# Patient Record
Sex: Female | Born: 1955 | Race: Black or African American | Hispanic: No | Smoking: Current every day smoker
Health system: Southern US, Community
[De-identification: ages and names within clinical notes are randomized; demographics above are authoritative.]

## PROBLEM LIST (undated history)

## (undated) DIAGNOSIS — M069 Rheumatoid arthritis, unspecified: Secondary | ICD-10-CM

---

## 2015-09-23 ENCOUNTER — Emergency Department
Admission: EM | Admit: 2015-09-23 | Discharge: 2015-09-24 | Disposition: A | Discharge status: Home / Self Care | Payer: Self-pay | Attending: Emergency Medicine | Admitting: Emergency Medicine

## 2015-09-23 DIAGNOSIS — F17200 Nicotine dependence, unspecified, uncomplicated: Secondary | ICD-10-CM | POA: Insufficient documentation

## 2015-09-23 DIAGNOSIS — M069 Rheumatoid arthritis, unspecified: Secondary | ICD-10-CM | POA: Insufficient documentation

## 2015-09-23 DIAGNOSIS — M25561 Pain in right knee: Secondary | ICD-10-CM | POA: Insufficient documentation

## 2015-09-23 DIAGNOSIS — M25562 Pain in left knee: Secondary | ICD-10-CM | POA: Insufficient documentation

## 2015-09-23 DIAGNOSIS — M79606 Pain in leg, unspecified: Secondary | ICD-10-CM

## 2015-09-23 DIAGNOSIS — R202 Paresthesia of skin: Secondary | ICD-10-CM | POA: Insufficient documentation

## 2015-09-23 HISTORY — DX: Rheumatoid arthritis, unspecified: M06.9

## 2015-09-23 MED ORDER — SODIUM CHLORIDE 0.9 % IV SOLN
250.0000 mg | Freq: Once | INTRAVENOUS | Status: AC
  Administered 2015-09-24: 250 mg via INTRAVENOUS
  Filled 2015-09-23: qty 2

## 2015-09-23 MED ORDER — METHYLPREDNISOLONE SODIUM SUCC 125 MG IJ SOLR
INTRAMUSCULAR | Status: AC
  Filled 2015-09-23: qty 2

## 2015-09-23 NOTE — ED Notes (Signed)
Pt arrived via EMS with C/O numbness/tingling in legs, Hx rheumatoid arthritis, hasnt been able to take meds because she ran out and her PCP would not refill without a visit. Pt states the weather change has caused a flare-up.

## 2015-09-24 MED ORDER — PREDNISONE 20 MG PO TABS: 20.0000 mg | ORAL_TABLET | Freq: Every day | ORAL | Status: AC

## 2015-09-24 NOTE — Discharge Instructions (Signed)
Rheumatoid Arthritis Rheumatoid arthritis is a long-term (chronic) inflammatory disease that causes pain, swelling, and stiffness of the joints. It can affect the entire body, including the eyes and lungs. The effects of rheumatoid arthritis vary widely among those with the condition. CAUSES The cause of rheumatoid arthritis is not known. It tends to run in families and is more common in women. Certain cells of the body's natural defense system (immune system) do not work properly and begin to attack healthy joints. It primarily involves the connective tissue that lines the joints (synovial membrane). This can cause damage to the joint. SYMPTOMS  Pain, stiffness, swelling, and decreased motion of many joints, especially in the hands and feet.  Stiffness that is worse in the morning. It may last 1-2 hours or longer.  Numbness and tingling in the hands.  Fatigue.  Loss of appetite.  Weight loss.  Low-grade fever.  Dry eyes and mouth.  Firm lumps (rheumatoid nodules) that grow beneath the skin in areas such as the elbows and hands. DIAGNOSIS Diagnosis is based on the symptoms described, an exam, and blood tests. Sometimes, X-rays are helpful. TREATMENT The goals of treatment are to relieve pain, reduce inflammation, and to slow down or stop joint damage and disability. Methods vary and may include:  Maintaining a balance of rest, exercise, and proper nutrition.  Your health care provider may adjust your medicines every 3 months until treatment goals are reached. Common medicines include:  Pain relievers (analgesics).  Corticosteroids and nonsteroidal anti-inflammatory drugs (NSAIDs) to reduce inflammation.  Disease-modifying antirheumatic drugs (DMARDs) to try to slow the course of the disease.  Biologic response modifiers to reduce inflammation and damage.  Physical therapy and occupational therapy.  Surgery for patients with severe joint damage. Joint replacement or fusing of  joints may be needed.  Routine monitoring and ongoing care, such as office visits, blood and urine tests, and X-rays. Your health care provider will work with you to identify the best treatment option for you, based on an assessment of the overall disease activity in your body. HOME CARE INSTRUCTIONS  Remain physically active and reduce activity when the disease gets worse.  Eat a well-balanced diet.  Put heat on affected joints when you wake up and before activities. Keep the heat on the affected joint for as long as directed by your health care provider.  Put ice on affected joints following activities or exercising.  Put ice in a plastic bag.  Place a towel between your skin and the bag.  Leave the ice on for 15-20 minutes, 3-4 times per day, or as directed by your health care provider.  Take medicines and supplements only as directed by your health care provider.  Use splints as directed by your health care provider. Splints help maintain joint position and function.  Do not sleep with pillows under your knees. This may lead to spasms.  Participate in a self-management program to keep current with the latest treatment and coping skills. SEEK IMMEDIATE MEDICAL CARE IF:  You have fainting episodes.  You have periods of extreme weakness.  You rapidly develop a hot, painful joint that is more severe than usual joint aches.  You have chills.  You have a fever. FOR MORE INFORMATION  [employer] College [employer] Rheumat[employer]: www.rheumatology.org  Arthritis Foundation: www.arthritis.org   This information is not intended to replace advice given to you by your health care provider. Make sure you discuss any questions you have with your health care provider.   Document  Released: 09/19/2000 Document Revised: 10/13/2014 Document Reviewed: 10/29/2011 Elsevier Interactive Patient Education 2016 Elsevier Inc[employer]  Musculoskeletal Pain Musculoskeletal pain is muscle and boney aches and  pains. These pains can occur in any part of the body. Your caregiver may treat you without knowing the cause of the pain. They may treat you if blood or urine tests, X-rays, and other tests were normal.  CAUSES There is often not a definite cause or reason for these pains. These pains may be caused by a type of germ (virus). The discomfort may also come from overuse. Overuse includes working out too hard when your body is not fit. Boney aches also come from weather changes. Bone is sensitive to atmospheric pressure changes. HOME CARE INSTRUCTIONS   Ask when your test results will be ready. Make sure you get your test results.  Only take over-the-counter or prescription medicines for pain, discomfort, or fever as directed by your caregiver. If you were given medications for your condition, do not drive, operate machinery or power tools, or sign legal documents for 24 hours. Do not drink alcohol. Do not take sleeping pills or other medications that may interfere with treatment.  Continue all activities unless the activities cause more pain. When the pain lessens, slowly resume normal activities. Gradually increase the intensity and duration of the activities or exercise.  During periods of severe pain, bed rest may be helpful. Lay or sit in any position that is comfortable.  Putting ice on the injured area.  Put ice in a bag.  Place a towel between your skin and the bag.  Leave the ice on for 15 to 20 minutes, 3 to 4 times a day.  Follow up with your caregiver for continued problems and no reason can be found for the pain. If the pain becomes worse or does not go away, it may be necessary to repeat tests or do additional testing. Your caregiver may need to look further for a possible cause. SEEK IMMEDIATE MEDICAL CARE IF:  You have pain that is getting worse and is not relieved by medications.  You develop chest pain that is associated with shortness or breath, sweating, feeling sick to your  stomach (nauseous), or throw up (vomit).  Your pain becomes localized to the abdomen.  You develop any new symptoms that seem different or that concern you. MAKE SURE YOU:   Understand these instructions.  Will watch your condition.  Will get help right away if you are not doing well or get worse.   This information is not intended to replace advice given to you by your health care provider. Make sure you discuss any questions you have with your health care provider.   Document Released: 09/22/2005 Document Revised: 12/15/2011 Document Reviewed: 05/27/2013 Elsevier Interactive Patient Education [employer]

## 2015-09-24 NOTE — ED Provider Notes (Signed)
[facility] Emergency Department Provider Note  ____________________________________________  Time seen: 11:25 PM  I have reviewed the triage vital signs and the nursing notes.   HISTORY  Chief Complaint Numbness    HPI Stephanie Hickman is a 59 y.o. female who complains of gradual onset worsening constant bilateral knee pain over the past 3 days. She has a history of rheumatoid arthritis and she states this is entirely consistent with flareups that she occasionally has. She's been off of her Humira and out of prednisone as well due to lack of follow-up with primary care to get timely refills. Denies any falls or new injuries. No fever chills chest pain shortness of breath abdominal pain or vomiting. Pain of some tingling in the feet which is typical for her flareups. No bowel or bladder incontinence or saddle anesthesia or back pain.  Denies any other medical history such as diabetes hypertension or heart disease.   Past Medical History  Diagnosis Date  . Rheumatoid arthritis (HCC)      There are no active problems to display for this patient.    History reviewed. No pertinent past surgical history.   Current Outpatient Rx  Name  Route  Sig  Dispense  Refill  . predniSONE (DELTASONE) 20 MG tablet   Oral   Take 1 tablet (20 mg total) by mouth daily.   10 tablet   0      Allergies Aspirin   No family history on file.  Social History Social History  Substance Use Topics  . Smoking status: Current Every Day Smoker  . Smokeless tobacco: None  . Alcohol Use: No    Review of Systems  Constitutional:   No fever or chills. No weight changes Eyes:   No blurry vision or double vision.  ENT:   No sore throat. Cardiovascular:   No chest pain. Respiratory:   No dyspnea or cough. Gastrointestinal:   Negative for abdominal pain, vomiting and diarrhea.  No BRBPR or melena. Genitourinary:   Negative for dysuria, urinary retention, bloody urine, or  difficulty urinating. Musculoskeletal:   Negative for back pain. Positive bilateral knee pain  Skin:   Negative for rash. Neurological:   Negative for headaches, focal weakness or numbness. Psychiatric:  No anxiety or depression.   Endocrine:  No hot/cold intolerance, changes in energy, or sleep difficulty.  10-point ROS otherwise negative.  ____________________________________________   PHYSICAL EXAM:  VITAL SIGNS: ED Triage Vitals  Enc Vitals Group     BP 09/23/15 2306 129/98 mmHg     Pulse Rate 09/23/15 2306 83     Resp 09/23/15 2306 18     Temp 09/23/15 2306 97.7 F (36.5 C)     Temp Source 09/23/15 2306 Oral     SpO2 09/23/15 2306 98 %     Weight 09/23/15 2306 193 lb (87.544 kg)     Height 09/23/15 2306 5\' 8"  (1.727 m)     Head Cir --      Peak Flow --      Pain Score 09/23/15 2308 10     Pain Loc --      Pain Edu? --      Excl. in GC? --     Vital signs reviewed, nursing assessments reviewed.   Constitutional:   Alert and oriented. Well appearing and in no distress. Eyes:   No scleral icterus. No conjunctival pallor. PERRL. EOMI ENT   Head:   Normocephalic and atraumatic.   Nose:   No  congestion/rhinnorhea. No septal hematoma   Mouth/Throat:   MMM, no pharyngeal erythema. No peritonsillar mass. No uvula shift.   Neck:   No stridor. No SubQ emphysema. No meningismus. Hematological/Lymphatic/Immunilogical:   No cervical lymphadenopathy. Cardiovascular:   RRR. Normal and symmetric distal pulses are present in all extremities. No murmurs, rubs, or gallops. Respiratory:   Normal respiratory effort without tachypnea nor retractions. Breath sounds are clear and equal bilaterally. No wheezes/rales/rhonchi. Gastrointestinal:   Soft and nontender. No distention. There is no CVA tenderness.  No rebound, rigidity, or guarding. Genitourinary:   deferred Musculoskeletal:   Mild tenderness bilateral knees, left greater than right. No edema, no swelling. No soft  tissue tenderness. No effusions. Intact range of motion. Extremities otherwise unremarkable. Neurologic:   Normal speech and language.  CN 2-10 normal. Motor grossly intact. No gross focal neurologic deficits are appreciated.  Skin:    Skin is warm, dry and intact. No rash noted.  No petechiae, purpura, or bullae. Psychiatric:   Mood and affect are normal. Speech and behavior are normal. Patient exhibits appropriate insight and judgment.  ____________________________________________    LABS (pertinent positives/negatives) (all labs ordered are listed, but only abnormal results are displayed) Labs Reviewed - No data to display ____________________________________________   EKG    ____________________________________________    RADIOLOGY    ____________________________________________   PROCEDURES   ____________________________________________   INITIAL IMPRESSION / ASSESSMENT AND PLAN / ED COURSE  Pertinent labs & imaging results that were available during my care of the patient were reviewed by me and considered in my medical decision making (see chart for details).  Patient presents with rheumatoid arthritis flareup. I will give her IV Solu-Medrol and reassess. Likely plan is to start on oral prednisone and have her follow up with primary care. Low suspicion for septic arthritis or osteomyelitis fracture dislocation or soft tissue infection.  ----------------------------------------- 12:40 AM on 09/24/2015 -----------------------------------------  Feels much better. We'll discharge home with oral prednisone. She'll follow-up with her doctor this week, will continue her weekly methotrexate injection that she is scheduled for today.   ____________________________________________   FINAL CLINICAL IMPRESSION(S) / ED DIAGNOSES  Final diagnoses:  Musculoskeletal pain of lower extremity, unspecified laterality  Rheumatoid arthritis flare (HCC)      Sharman Cheek, MD 09/24/15 0040

## 2015-11-16 ENCOUNTER — Emergency Department
Admission: EM | Admit: 2015-11-16 | Discharge: 2015-11-16 | Disposition: A | Discharge status: Home / Self Care | Payer: Self-pay | Attending: Emergency Medicine | Admitting: Emergency Medicine

## 2015-11-16 ENCOUNTER — Emergency Department: Payer: Self-pay

## 2015-11-16 DIAGNOSIS — F17200 Nicotine dependence, unspecified, uncomplicated: Secondary | ICD-10-CM | POA: Insufficient documentation

## 2015-11-16 DIAGNOSIS — F419 Anxiety disorder, unspecified: Secondary | ICD-10-CM | POA: Insufficient documentation

## 2015-11-16 DIAGNOSIS — R42 Dizziness and giddiness: Secondary | ICD-10-CM | POA: Insufficient documentation

## 2015-11-16 DIAGNOSIS — R112 Nausea with vomiting, unspecified: Secondary | ICD-10-CM | POA: Insufficient documentation

## 2015-11-16 DIAGNOSIS — Z7952 Long term (current) use of systemic steroids: Secondary | ICD-10-CM | POA: Insufficient documentation

## 2015-11-16 LAB — CBC WITH DIFFERENTIAL/PLATELET
BASOS ABS: 0.1 10*3/uL (ref 0–0.1)
BASOS PCT: 1 %
EOS PCT: 3 %
Eosinophils Absolute: 0.2 10*3/uL (ref 0–0.7)
HCT: 41.5 % (ref 35.0–47.0)
Hemoglobin: 13.4 g/dL (ref 12.0–16.0)
LYMPHS PCT: 27 %
Lymphs Abs: 2.4 10*3/uL (ref 1.0–3.6)
MCH: 30.2 pg (ref 26.0–34.0)
MCHC: 32.3 g/dL (ref 32.0–36.0)
MCV: 93.7 fL (ref 80.0–100.0)
MONO ABS: 0.7 10*3/uL (ref 0.2–0.9)
MONOS PCT: 8 %
Neutro Abs: 5.4 10*3/uL (ref 1.4–6.5)
Neutrophils Relative %: 61 %
PLATELETS: 281 10*3/uL (ref 150–440)
RBC: 4.43 MIL/uL (ref 3.80–5.20)
RDW: 15.6 % — AB (ref 11.5–14.5)
WBC: 8.9 10*3/uL (ref 3.6–11.0)

## 2015-11-16 LAB — COMPREHENSIVE METABOLIC PANEL
ALT: 10 U/L — ABNORMAL LOW (ref 14–54)
ANION GAP: 7 (ref 5–15)
AST: 19 U/L (ref 15–41)
Albumin: 2.8 g/dL — ABNORMAL LOW (ref 3.5–5.0)
Alkaline Phosphatase: 61 U/L (ref 38–126)
BUN: 11 mg/dL (ref 6–20)
CHLORIDE: 108 mmol/L (ref 101–111)
CO2: 22 mmol/L (ref 22–32)
Calcium: 8.1 mg/dL — ABNORMAL LOW (ref 8.9–10.3)
Creatinine, Ser: 0.97 mg/dL (ref 0.44–1.00)
GFR calc Af Amer: >60 mL/min (ref >60)
Glucose, Bld: 173 mg/dL — ABNORMAL HIGH (ref 65–99)
POTASSIUM: 3.6 mmol/L (ref 3.5–5.1)
Sodium: 137 mmol/L (ref 135–145)
TOTAL PROTEIN: 5.7 g/dL — AB (ref 6.5–8.1)
Total Bilirubin: 0.5 mg/dL (ref 0.3–1.2)

## 2015-11-16 LAB — URINALYSIS COMPLETE WITH MICROSCOPIC (ARMC ONLY)
Bilirubin Urine: NEGATIVE
Glucose, UA: NEGATIVE mg/dL
HGB URINE DIPSTICK: NEGATIVE
KETONES UR: NEGATIVE mg/dL
LEUKOCYTES UA: NEGATIVE
NITRITE: NEGATIVE
PH: 5 (ref 5.0–8.0)
PROTEIN: NEGATIVE mg/dL
SPECIFIC GRAVITY, URINE: 1.018 (ref 1.005–1.030)

## 2015-11-16 LAB — BRAIN NATRIURETIC PEPTIDE: B NATRIURETIC PEPTIDE 5: 69 pg/mL (ref 0.0–100.0)

## 2015-11-16 LAB — TROPONIN I: TROPONIN I: 0.03 ng/mL (ref <0.031)

## 2015-11-16 MED ORDER — SODIUM CHLORIDE 0.9 % IV BOLUS (SEPSIS)
1000.0000 mL | Freq: Once | INTRAVENOUS | Status: AC
  Administered 2015-11-16: 1000 mL via INTRAVENOUS

## 2015-11-16 MED ORDER — SODIUM CHLORIDE 0.9 % IV BOLUS (SEPSIS)
500.0000 mL | Freq: Once | INTRAVENOUS | Status: AC
  Administered 2015-11-16: 500 mL via INTRAVENOUS

## 2015-11-16 NOTE — ED Provider Notes (Addendum)
[facility] Emergency Department Provider Note  ____________________________________________   I have reviewed the triage vital signs and the nursing notes.   HISTORY  Chief Complaint Weakness    HPI Stephanie Hickman is a 60 y.o. female with a history of recurrent arthritis on prednisone otherwise largely healthy states for the last week or so she's been having a her eye with cough and runny nose. He is currently unemployed so she is raising my medical neoplasm which is been doing twice a week for many months. Today she states they were unable to replace the plasma that they took out the usually do. Apparently there was some complication with the machine. They then discharged her apparently fluid depleted compared to when she arrived. She states she felt okay when she left but then as she stood up in the grocery store she became lightheaded and felt generally weak. She did not fall or hit her head or pass out. She did have some nausea and vomiting. She denies chest pain. She denies shortness of breath this time. Her EMS she was very anxious and upset. Their initial blood pressure was in the 90s. They did institute an IV and started giving her fluid she has had about 100 mL's prior to arrival. She denies any abdominal pain or leg swelling  Past Medical History  Diagnosis Date  . Rheumatoid arthritis (HCC)     There are no active problems to display for this patient.   History reviewed. No pertinent past surgical history.  Current Outpatient Rx  Name  Route  Sig  Dispense  Refill  . predniSONE (DELTASONE) 20 MG tablet   Oral   Take 1 tablet (20 mg total) by mouth daily.   10 tablet   0     Allergies Aspirin  No family history on file.  Social History Social History  Substance Use Topics  . Smoking status: Current Every Day Smoker  . Smokeless tobacco: None  . Alcohol Use: No    Review of Systems Constitutional: No fever/chills Eyes: No visual  changes. ENT: No sore throat. No stiff neck no neck pain Cardiovascular: Denies chest pain. Respiratory: Denies shortness of breath. Gastrointestinal:  See history of present illness  Genitourinary: Negative for dysuria. Musculoskeletal: Negative lower extremity swelling Skin: Negative for rash. Neurological: Negative for headaches, focal weakness or numbness. 10-point ROS otherwise negative.  ____________________________________________   PHYSICAL EXAM:  VITAL SIGNS: ED Triage Vitals  Enc Vitals Group     BP 11/16/15 1613 97/80 mmHg     Pulse Rate 11/16/15 1613 93     Resp 11/16/15 1613 22     Temp 11/16/15 1613 97.9 F (36.6 C)     Temp Source 11/16/15 1613 Oral     SpO2 11/16/15 1613 94 %     Weight 11/16/15 1613 190 lb (86.183 kg)     Height 11/16/15 1613 5\' 8"  (1.727 m)     Head Cir --      Peak Flow --      Pain Score 11/16/15 1614 6     Pain Loc --      Pain Edu? --      Excl. in GC? --     Constitutional: Alert and oriented. Well appearing and in no acute distress.Very anxious and upset but otherwise nontoxic  Eyes: Conjunctivae are normal. PERRL. EOMI. Head: Atraumatic. Nose: No congestion/rhinnorhea. Mouth/Throat: Mucous membranes are moist.  Oropharynx non-erythematous. Neck: No stridor.   Nontender with no meningismus Cardiovascular:  Normal rate, regular rhythm. Grossly normal heart sounds.  Good peripheral circulation. Respiratory: Normal respiratory effort.  No retractions. Lungs CTAB. Abdominal: Soft and nontender. No distention. No guarding no rebound Back:  There is no focal tenderness or step off there is no midline tenderness there are no lesions noted. there is no CVA tenderness Musculoskeletal: No lower extremity tenderness. No joint effusions, no DVT signs strong distal pulses no edema Neurologic:  Normal speech and language. No gross focal neurologic deficits are appreciated.  Skin:  Skin is warm, dry and intact. No rash noted. Psychiatric: Mood  and affect areanxious and upset . Speech and behavior are normal.  ____________________________________________   LABS (all labs ordered are listed, but only abnormal results are displayed)  Labs Reviewed  CBC WITH DIFFERENTIAL/PLATELET - Abnormal; Notable for the following:    RDW 15.6 (*)    All other components within normal limits  COMPREHENSIVE METABOLIC PANEL  TROPONIN I  BRAIN NATRIURETIC PEPTIDE   ____________________________________________  EKG  I personally interpreted any EKGs ordered by me or triage Normal sinus rhythm rate 97 bpm no acute ST elevation or acute ST depression normal axis unremarkable EKG  ____________________________________________  RADIOLOGY  I reviewed any imaging ordered by me or triage that were performed during my shift ____________________________________________   PROCEDURES  Procedure(s) performed: None  Critical Care performed: None  ____________________________________________   INITIAL IMPRESSION / ASSESSMENT AND PLAN / ED COURSE  Pertinent labs & imaging results that were available during my care of the patient were reviewed by me and considered in my medical decision making (see chart for details).  Patient with lightheadedness in the context of giving plasma, and having a head cold. She knew the pass out and then vomited. It is certainly consistent with a vasovagal reaction to volume depletion. We will give her IV fluids we'll check basic blood work and reassess. At this time there is no evidence of DIC or other significant lung pathology. Patient does have a cough and we'll obtain a chest x-ray. I suspect the combination of URI symptoms and frequent plasma donations coupled with not being able to have her plasma return to her likely cause these symptoms.   ----------------------------------------- 6:21 PM on 11/16/2015 -----------------------------------------  She feels much better after a sandwich and IV fluid. No  evidence of significant reaction from her procedure, she is comfortable in the bed heart rate is in the 90s blood pressures in the 130s. We'll check a UA on her precaution give her 500 more of IV fluid as a precaution and I think after that she can go home. Patient feels much better has no complaints ____________________________________________   FINAL CLINICAL IMPRESSION(S) / ED DIAGNOSES  Final diagnoses:  None      This chart was dictated using voice recognition software.  Despite best efforts to proofread,  errors can occur which can change meaning.    Jeanmarie Plant, MD 11/16/15 1657  Jeanmarie Plant, MD 11/16/15 Rickey Primus

## 2015-11-16 NOTE — Discharge Instructions (Signed)
Dizziness I would avoid giving plasma for a few weeks. Return to the emergency department for any new or worrisome symptoms. Dizziness is a common problem. It is a feeling of unsteadiness or light-headedness. You may feel like you are about to faint. Dizziness can lead to injury if you stumble or fall. Anyone can become dizzy, but dizziness is more common in older adults. This condition can be caused by a number of things, including medicines, dehydration, or illness. HOME CARE INSTRUCTIONS Taking these steps may help with your condition: Eating and Drinking  Drink enough fluid to keep your urine clear or pale yellow. This helps to keep you from becoming dehydrated. Try to drink more clear fluids, such as water.  Do not drink alcohol.  Limit your caffeine intake if directed by your health care provider.  Limit your salt intake if directed by your health care provider. Activity  Avoid making quick movements.  Rise slowly from chairs and steady yourself until you feel okay.  In the morning, first sit up on the side of the bed. When you feel okay, stand slowly while you hold onto something until you know that your balance is fine.  Move your legs often if you need to stand in one place for a long time. Tighten and relax your muscles in your legs while you are standing.  Do not drive or operate heavy machinery if you feel dizzy.  Avoid bending down if you feel dizzy. Place items in your home so that they are easy for you to reach without leaning over. Lifestyle  Do not use any tobacco products, including cigarettes, chewing tobacco, or electronic cigarettes. If you need help quitting, ask your health care provider.  Try to reduce your stress level, such as with yoga or meditation. Talk with your health care provider if you need help. General Instructions  Watch your dizziness for any changes.  Take medicines only as directed by your health care provider. Talk with your health care  provider if you think that your dizziness is caused by a medicine that you are taking.  Tell a friend or a family member that you are feeling dizzy. If he or she notices any changes in your behavior, have this person call your health care provider.  Keep all follow-up visits as directed by your health care provider. This is important. SEEK MEDICAL CARE IF:  Your dizziness does not go away.  Your dizziness or light-headedness gets worse.  You feel nauseous.  You have reduced hearing.  You have new symptoms.  You are unsteady on your feet or you feel like the room is spinning. SEEK IMMEDIATE MEDICAL CARE IF:  You vomit or have diarrhea and are unable to eat or drink anything.  You have problems talking, walking, swallowing, or using your arms, hands, or legs.  You feel generally weak.  You are not thinking clearly or you have trouble forming sentences. It may take a friend or family member to notice this.  You have chest pain, abdominal pain, shortness of breath, or sweating.  Your vision changes.  You notice any bleeding.  You have a headache.  You have neck pain or a stiff neck.  You have a fever.   This information is not intended to replace advice given to you by your health care provider. Make sure you discuss any questions you have with your health care provider.   Document Released: 03/18/2001 Document Revised: 02/06/2015 Document Reviewed: 09/18/2014 Elsevier Interactive Patient Education 2016 Elsevier  Inc. ° °

## 2015-11-16 NOTE — ED Notes (Signed)
Pt attempted to leave before D/C.  Asked pt to go back to room.  Pt stated "I got to go, I can't spend the night here".  Told pt that she did not have to spend night but requested she stay till D/C paperwork could be gone through.  Pt stated she needed to be in lobby to call for ride, told pt she could call from room.  Pt insistent on leaving.  Told pt she was free to leave but this RN would need to remove IV.  Pt state "I don't have them, I took all of that off".  Had pt sit in wheelchair and roll up sleeves.  Pt did so reluctantly and pt did have IV that this RN removed.  Told pt she was free to go to lobby, but pt requested to go back to room.

## 2015-11-16 NOTE — ED Notes (Addendum)
Pt bib EMS w/ c/o weakness, n/v and cough after donating at plasma center.  Pt sts she has been donating 2 X week for 3 months.  Pt also c/o cough/head cold for 1 week.  Pt anxious, but resp even and unlabored. Pt c/o L side pain.  Pt received 4 zofran IV

## 2016-10-28 IMAGING — CR DG CHEST 2V
2 series · 2 of 2 positions shown · non-contrast
Comparison: None.

CLINICAL DATA: Weakness following giving plasma

EXAM:
CHEST  2 VIEW

[chest lat]
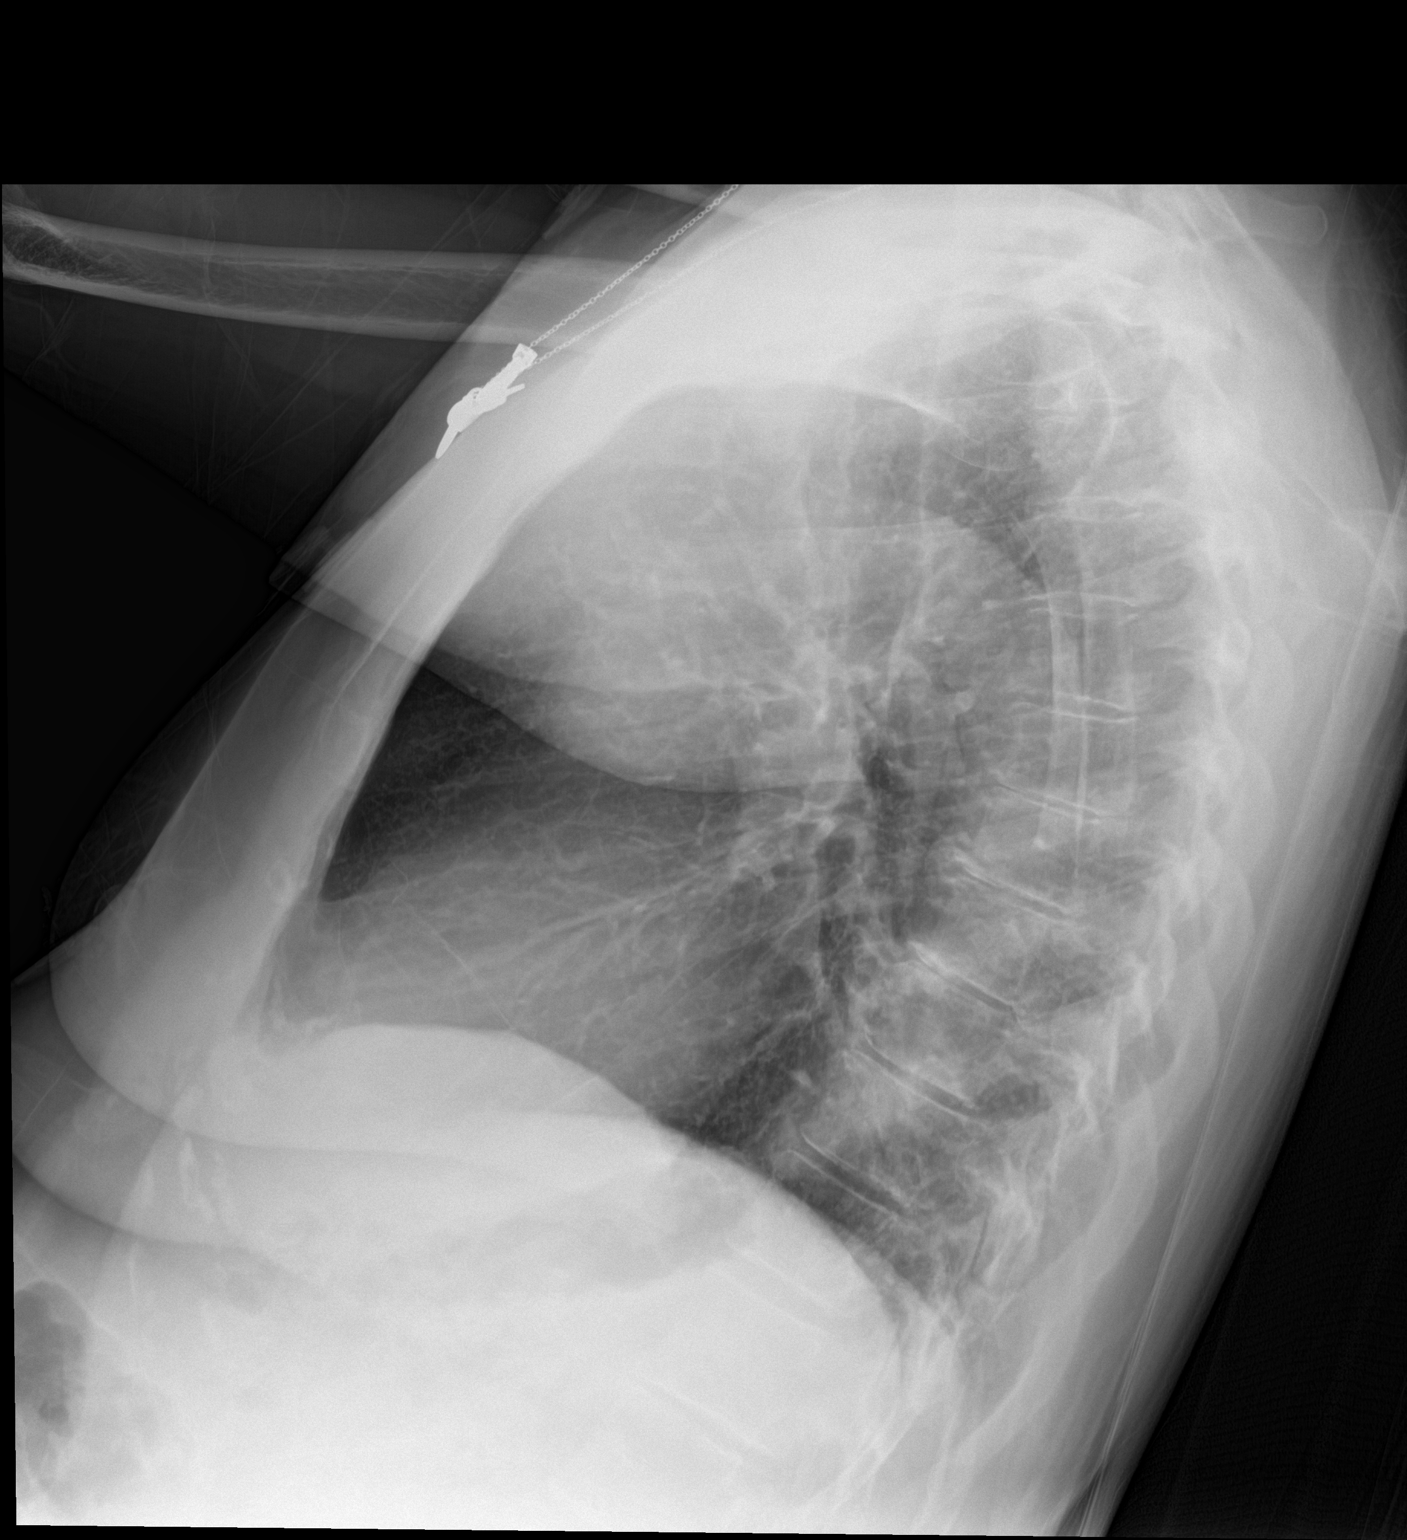

[chest ap]
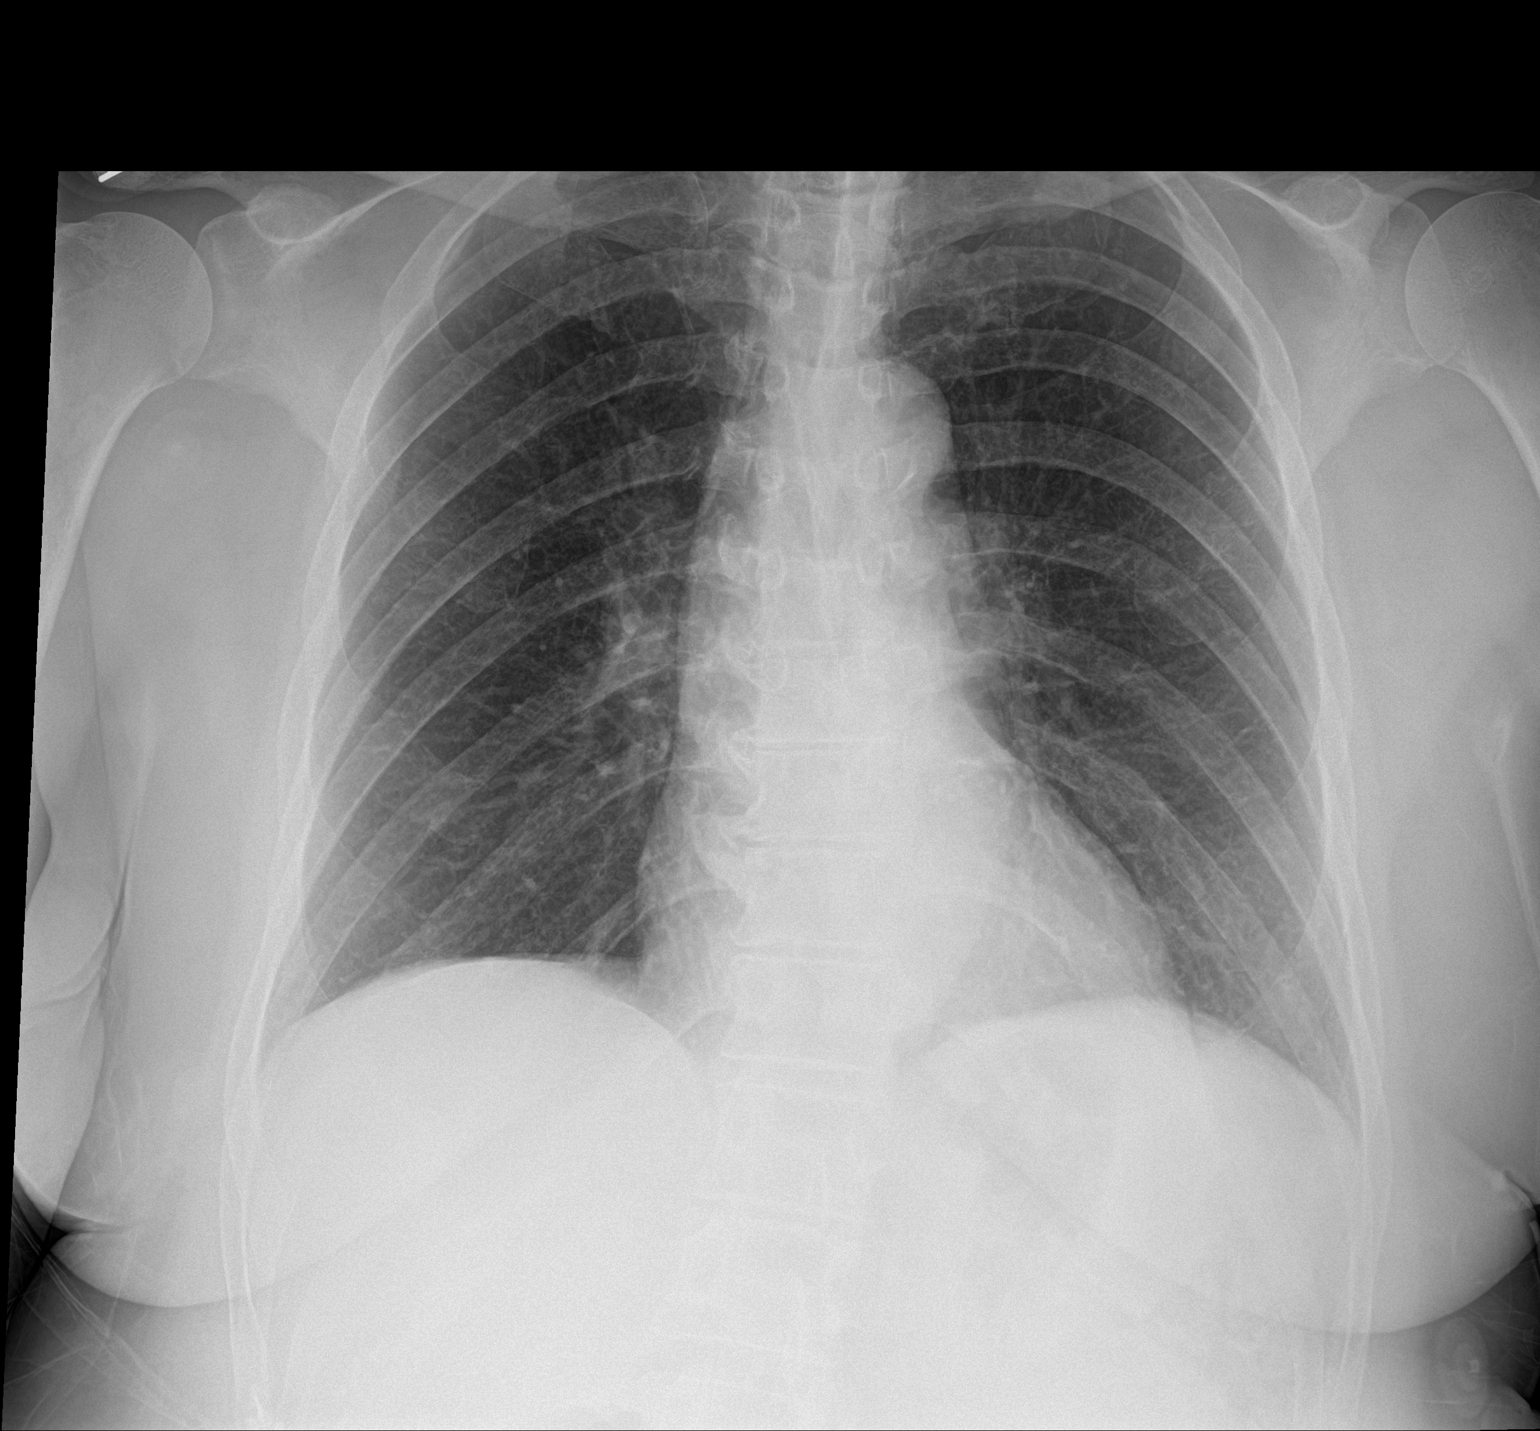

[2 of 2 positions shown; findings below may reference images not displayed]

FINDINGS: The heart size and mediastinal contours are within normal limits.
Both lungs are clear. The visualized skeletal structures are
unremarkable.
IMPRESSION: No active cardiopulmonary disease.
